# Patient Record
Sex: Male | Born: 1965 | Race: Black or African American | Hispanic: No | Marital: Single | Smoking: Never smoker
Health system: Southern US, Community
[De-identification: ages and names within clinical notes are randomized; demographics above are authoritative.]

## PROBLEM LIST (undated history)

## (undated) DIAGNOSIS — E876 Hypokalemia: Secondary | ICD-10-CM

## (undated) DIAGNOSIS — I10 Essential (primary) hypertension: Secondary | ICD-10-CM

## (undated) DIAGNOSIS — H409 Unspecified glaucoma: Secondary | ICD-10-CM

## (undated) DIAGNOSIS — R569 Unspecified convulsions: Secondary | ICD-10-CM

## (undated) HISTORY — PX: OTHER SURGICAL HISTORY: SHX169

---

## 1985-02-21 HISTORY — PX: LUNG SURGERY: SHX703

## 2012-04-04 ENCOUNTER — Emergency Department (HOSPITAL_BASED_OUTPATIENT_CLINIC_OR_DEPARTMENT_OTHER)
Admission: EM | Admit: 2012-04-04 | Discharge: 2012-04-04 | Disposition: A | Discharge status: Home / Self Care | Payer: Self-pay | Attending: Emergency Medicine | Admitting: Emergency Medicine

## 2012-04-04 ENCOUNTER — Encounter (HOSPITAL_BASED_OUTPATIENT_CLINIC_OR_DEPARTMENT_OTHER): Payer: Self-pay | Admitting: *Deleted

## 2012-04-04 DIAGNOSIS — R109 Unspecified abdominal pain: Secondary | ICD-10-CM | POA: Insufficient documentation

## 2012-04-04 DIAGNOSIS — M545 Low back pain, unspecified: Secondary | ICD-10-CM | POA: Insufficient documentation

## 2012-04-04 DIAGNOSIS — H409 Unspecified glaucoma: Secondary | ICD-10-CM | POA: Insufficient documentation

## 2012-04-04 DIAGNOSIS — R51 Headache: Secondary | ICD-10-CM | POA: Insufficient documentation

## 2012-04-04 HISTORY — DX: Unspecified glaucoma: H40.9

## 2012-04-04 LAB — COMPREHENSIVE METABOLIC PANEL
Albumin: 3.8 g/dL (ref 3.5–5.2)
BUN: 15 mg/dL (ref 6–23)
Calcium: 9.5 mg/dL (ref 8.4–10.5)
Creatinine, Ser: 1.1 mg/dL (ref 0.50–1.35)
Total Protein: 7.2 g/dL (ref 6.0–8.3)

## 2012-04-04 LAB — CBC WITH DIFFERENTIAL/PLATELET
Basophils Relative: 0 % (ref 0–1)
Eosinophils Absolute: 0 10*3/uL (ref 0.0–0.7)
HCT: 43.1 % (ref 39.0–52.0)
Hemoglobin: 15.3 g/dL (ref 13.0–17.0)
MCH: 30.9 pg (ref 26.0–34.0)
MCHC: 35.5 g/dL (ref 30.0–36.0)
Monocytes Absolute: 0.4 10*3/uL (ref 0.1–1.0)
Monocytes Relative: 8 % (ref 3–12)

## 2012-04-04 LAB — URINALYSIS, ROUTINE W REFLEX MICROSCOPIC
Bilirubin Urine: NEGATIVE
Glucose, UA: NEGATIVE mg/dL
Ketones, ur: NEGATIVE mg/dL
Nitrite: NEGATIVE
Protein, ur: NEGATIVE mg/dL
pH: 5.5 (ref 5.0–8.0)

## 2012-04-04 MED ORDER — TIMOLOL MALEATE 0.5 % OP SOLN: 1.0000 [drp] | Freq: Two times a day (BID) | OPHTHALMIC | Status: DC

## 2012-04-04 MED ORDER — TRAMADOL HCL 50 MG PO TABS: 50.0000 mg | ORAL_TABLET | Freq: Four times a day (QID) | ORAL | Status: DC | PRN

## 2012-04-04 NOTE — ED Notes (Signed)
Pt c/o h/a x 1 days and lower back pain x 3 days

## 2012-04-04 NOTE — ED Provider Notes (Signed)
History     CSN: [id number]  Arrival date & time 04/04/12  1540   First MD Initiated Contact with Patient 04/04/12 1545      Chief Complaint  Patient presents with  . Headache  . Abdominal Pain   (Consider location/radiation/quality/duration/timing/severity/associated sxs/prior treatment) Patient is a 47 y.o. male presenting with headaches and abdominal pain.  Headache Associated symptoms: abdominal pain   Abdominal Pain  Pt presents with two separate complaints. He states he woke up during the night with a moderate aching diffuse headache that was improved with wife massaging his scalp. He states he fell back asleep and was feeling better when he woke up. Pain is now a 1/10. Not the worst headache of his life and not sudden in onset. He has history of chronic glaucoma not using eye drops due to unable to afford the meds. He states has had symptoms like this with his glaucoma in the past.   He has a secondary complaint of moderate aching lower abdominal pain, radiating into R lumbar area, associated with dark urine, but no dysuria, hematuria, fever, vomiting diarrhea or constipation. He states he has had UTI in the past as well as 'liver damage', both diagnosed at [facility]. No particular provoking or relieving factors.   Past Medical History  Diagnosis Date  . Glaucoma     History reviewed. No pertinent past surgical history.  History reviewed. No pertinent family history.  History  Substance Use Topics  . Smoking status: Never Smoker   . Smokeless tobacco: Not on file  . Alcohol Use: 1.8 oz/week    3 Cans of beer per week      Review of Systems  Gastrointestinal: Positive for abdominal pain.  Neurological: Positive for headaches.   All other systems reviewed and are negative except as noted in HPI.   Allergies  Penicillins  Home Medications   Current Outpatient Rx  Name  Route  Sig  Dispense  Refill  . travoprost, benzalkonium, (TRAVATAN) 0.004 %  ophthalmic solution      1 drop at bedtime.           BP 128/89  Pulse 84  Temp(Src) 98.3 F (36.8 C) (Oral)  Resp 16  Ht 5\' 11"  (1.803 m)  Wt 190 lb (86.183 kg)  BMI 26.51 kg/m2  SpO2 99%  Physical Exam  Nursing note and vitals reviewed. Constitutional: He is oriented to person, place, and time. He appears well-developed and well-nourished.  HENT:  Head: Normocephalic and atraumatic.  Eyes: Conjunctivae and EOM are normal. Pupils are equal, round, and reactive to light. No scleral icterus.  Neck: Normal range of motion. Neck supple.  Cardiovascular: Normal rate, normal heart sounds and intact distal pulses.   Pulmonary/Chest: Effort normal and breath sounds normal.  Abdominal: Bowel sounds are normal. He exhibits no distension. There is tenderness (suprapubic). There is no rebound and no guarding.  Musculoskeletal: Normal range of motion. He exhibits no edema and no tenderness.  Neurological: He is alert and oriented to person, place, and time. He has normal strength. No cranial nerve deficit or sensory deficit.  Skin: Skin is warm and dry. No rash noted.  Psychiatric: He has a normal mood and affect.    ED Course  Procedures (including critical care time)  Labs Reviewed  URINALYSIS, ROUTINE W REFLEX MICROSCOPIC  CBC WITH DIFFERENTIAL  COMPREHENSIVE METABOLIC PANEL   No results found.   No diagnosis found.    MDM  No blurry vision, pupils normal,  no conjunctival injection to suggest acute glaucoma. Abdomen is benign, doubt surgical process. Will check labs/UA.    4:27 PM Lab computer is down temporarily but UA report shows 0.2 bili, otherwise normal.    5:24 PM Labs still not crossing over, but CBC and CMP results reviewed and normal. Will give Rx for pain meds as needed. Timolol to help with glaucoma because this medicine is relatively inexpensive. Advised close Ophtho follow up for recheck and to re-establish for long term care.   Charles B. Bernette Mayers,  MD 04/04/12 1725

## 2012-09-03 ENCOUNTER — Emergency Department (HOSPITAL_BASED_OUTPATIENT_CLINIC_OR_DEPARTMENT_OTHER)
Admission: EM | Admit: 2012-09-03 | Discharge: 2012-09-03 | Disposition: A | Discharge status: Home / Self Care | Payer: Self-pay | Attending: Emergency Medicine | Admitting: Emergency Medicine

## 2012-09-03 ENCOUNTER — Emergency Department (HOSPITAL_BASED_OUTPATIENT_CLINIC_OR_DEPARTMENT_OTHER): Payer: Self-pay

## 2012-09-03 ENCOUNTER — Encounter (HOSPITAL_BASED_OUTPATIENT_CLINIC_OR_DEPARTMENT_OTHER): Payer: Self-pay | Admitting: Family Medicine

## 2012-09-03 DIAGNOSIS — M25569 Pain in unspecified knee: Secondary | ICD-10-CM | POA: Insufficient documentation

## 2012-09-03 DIAGNOSIS — H409 Unspecified glaucoma: Secondary | ICD-10-CM | POA: Insufficient documentation

## 2012-09-03 DIAGNOSIS — M7989 Other specified soft tissue disorders: Secondary | ICD-10-CM | POA: Insufficient documentation

## 2012-09-03 DIAGNOSIS — M25561 Pain in right knee: Secondary | ICD-10-CM

## 2012-09-03 DIAGNOSIS — Z79899 Other long term (current) drug therapy: Secondary | ICD-10-CM | POA: Insufficient documentation

## 2012-09-03 DIAGNOSIS — Z880 Allergy status to penicillin: Secondary | ICD-10-CM | POA: Insufficient documentation

## 2012-09-03 LAB — CBC WITH DIFFERENTIAL/PLATELET
Eosinophils Relative: 1 % (ref 0–5)
HCT: 43.6 % (ref 39.0–52.0)
Lymphocytes Relative: 35 % (ref 12–46)
Lymphs Abs: 1.7 10*3/uL (ref 0.7–4.0)
MCV: 88.8 fL (ref 78.0–100.0)
Platelets: 216 10*3/uL (ref 150–400)
RBC: 4.91 MIL/uL (ref 4.22–5.81)
WBC: 4.7 10*3/uL (ref 4.0–10.5)

## 2012-09-03 LAB — BASIC METABOLIC PANEL
CO2: 22 mEq/L (ref 19–32)
Calcium: 9.8 mg/dL (ref 8.4–10.5)
Glucose, Bld: 84 mg/dL (ref 70–99)
Sodium: 140 mEq/L (ref 135–145)

## 2012-09-03 MED ORDER — HYDROCODONE-ACETAMINOPHEN 5-325 MG PO TABS: 2.0000 | ORAL_TABLET | ORAL | Status: DC | PRN

## 2012-09-03 MED ORDER — INDOMETHACIN 25 MG PO CAPS: 25.0000 mg | ORAL_CAPSULE | Freq: Three times a day (TID) | ORAL | Status: DC | PRN

## 2012-09-03 NOTE — ED Provider Notes (Signed)
Medical screening examination/treatment/procedure(s) were performed by non-physician practitioner and as supervising physician I was immediately available for consultation/collaboration.   Gwyneth Sprout, MD 09/03/12 2011

## 2012-09-03 NOTE — ED Notes (Signed)
Pt c/o right knee pain x 10 days, no known injury.

## 2012-09-03 NOTE — ED Provider Notes (Signed)
History    CSN: [id number] Arrival date & time 09/03/12  1558  First MD Initiated Contact with Patient 09/03/12 1614     Chief Complaint  Patient presents with  . Knee Pain   (Consider location/radiation/quality/duration/timing/severity/associated sxs/prior Treatment) Patient is a 47 y.o. male presenting with knee pain. The history is provided by the patient. No language interpreter was used.  Knee Pain Location:  Knee Time since incident:  10 days Injury: no   Knee location:  R knee Pain details:    Quality:  Aching   Severity:  Moderate   Duration:  10 days   Timing:  Constant   Progression:  Worsening Chronicity:  New Dislocation: no   Prior injury to area:  No Relieved by:  Nothing Pt complains of pain in his right knee.  Pt reports knee has felt hot like it is infected  Pt denies fever or chills, no illness. No uti or uri symptoms.  No std risk Past Medical History  Diagnosis Date  . Glaucoma    Past Surgical History  Procedure Laterality Date  . Arm surgery     No family history on file. History  Substance Use Topics  . Smoking status: Never Smoker   . Smokeless tobacco: Not on file  . Alcohol Use: 1.8 oz/week    3 Cans of beer per week    Review of Systems  Musculoskeletal: Positive for myalgias.  All other systems reviewed and are negative.    Allergies  Penicillins  Home Medications   Current Outpatient Rx  Name  Route  Sig  Dispense  Refill  . timolol (TIMOPTIC) 0.5 % ophthalmic solution   Ophthalmic   Apply 1 drop to eye every 12 (twelve) hours.   15 mL   1   . traMADol (ULTRAM) 50 MG tablet   Oral   Take 1 tablet (50 mg total) by mouth every 6 (six) hours as needed for pain.   15 tablet   0   . travoprost, benzalkonium, (TRAVATAN) 0.004 % ophthalmic solution      1 drop at bedtime.          BP 140/82  Pulse 82  Temp(Src) 98.5 F (36.9 C) (Oral)  Resp 18  Ht 5\' 11"  (1.803 m)  Wt 193 lb (87.544 kg)  BMI 26.93 kg/m2   SpO2 100% Physical Exam  Nursing note and vitals reviewed. Constitutional: He is oriented to person, place, and time. He appears well-developed and well-nourished.  Musculoskeletal: Normal range of motion.  Right knee slight swelling,  No effusion,  No redness,  nv and ns intact  Neurological: He is alert and oriented to person, place, and time. He has normal reflexes.  Skin: Skin is warm.    ED Course  Procedures (including critical care time) Labs Reviewed  CBC WITH DIFFERENTIAL  BASIC METABOLIC PANEL  URIC ACID  URINALYSIS, ROUTINE W REFLEX MICROSCOPIC   Dg Knee Complete 4 Views Right  09/03/2012   *RADIOLOGY REPORT*  Clinical Data: Right knee pain.  RIGHT KNEE - COMPLETE 4+ VIEW  Comparison: None.  Findings: Four views of the knee were obtained.  Negative for fracture or dislocation.  Mild degenerative changes in the medial knee compartment and patellofemoral compartment.  There may be a small suprapatellar joint effusion.  IMPRESSION: Mild degenerative changes.  No acute bony abnormality.   Original Report Authenticated By: Richarda Overlie, M.D.   No diagnosis found.  MDM  Cbc normal.  Xray shows degenerative changes  uric acid is slightly elevated.   I suspect gout.  Pt advised to follow up with Dr. Pearletha Forge for recheck in 1 week.   Rx for indocin and hydrocodone  Elson Areas, [place] 09/03/12 1812

## 2012-09-03 NOTE — ED Notes (Signed)
pa at bedside. 

## 2012-10-08 ENCOUNTER — Encounter (HOSPITAL_BASED_OUTPATIENT_CLINIC_OR_DEPARTMENT_OTHER): Payer: Self-pay | Admitting: *Deleted

## 2012-10-08 ENCOUNTER — Emergency Department (HOSPITAL_BASED_OUTPATIENT_CLINIC_OR_DEPARTMENT_OTHER)
Admission: EM | Admit: 2012-10-08 | Discharge: 2012-10-08 | Disposition: A | Discharge status: Home / Self Care | Payer: Self-pay | Attending: Emergency Medicine | Admitting: Emergency Medicine

## 2012-10-08 DIAGNOSIS — Z880 Allergy status to penicillin: Secondary | ICD-10-CM | POA: Insufficient documentation

## 2012-10-08 DIAGNOSIS — Z8669 Personal history of other diseases of the nervous system and sense organs: Secondary | ICD-10-CM | POA: Insufficient documentation

## 2012-10-08 DIAGNOSIS — Z202 Contact with and (suspected) exposure to infections with a predominantly sexual mode of transmission: Secondary | ICD-10-CM | POA: Insufficient documentation

## 2012-10-08 MED ORDER — ONDANSETRON 4 MG PO TBDP
4.0000 mg | ORAL_TABLET | Freq: Once | ORAL | Status: AC
  Administered 2012-10-08: 4 mg via ORAL
  Filled 2012-10-08: qty 1

## 2012-10-08 MED ORDER — CEFTRIAXONE SODIUM 250 MG IJ SOLR
250.0000 mg | Freq: Once | INTRAMUSCULAR | Status: AC
  Administered 2012-10-08: 250 mg via INTRAMUSCULAR
  Filled 2012-10-08: qty 250

## 2012-10-08 MED ORDER — AZITHROMYCIN 1 G PO PACK
1.0000 g | PACK | Freq: Once | ORAL | Status: AC
  Administered 2012-10-08: 1 g via ORAL
  Filled 2012-10-08: qty 1

## 2012-10-08 MED ORDER — LIDOCAINE HCL (PF) 1 % IJ SOLN
INTRAMUSCULAR | Status: AC
  Administered 2012-10-08: 5 mL
  Filled 2012-10-08: qty 5

## 2012-10-08 MED ORDER — METRONIDAZOLE 500 MG PO TABS
2000.0000 mg | ORAL_TABLET | Freq: Once | ORAL | Status: AC
  Administered 2012-10-08: 2000 mg via ORAL
  Filled 2012-10-08: qty 4

## 2012-10-08 NOTE — ED Notes (Signed)
Pt c/o penile discharge x 3 weeks
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
## 2012-12-30 ENCOUNTER — Emergency Department (HOSPITAL_BASED_OUTPATIENT_CLINIC_OR_DEPARTMENT_OTHER)
Admission: EM | Admit: 2012-12-30 | Discharge: 2012-12-30 | Disposition: A | Discharge status: Home / Self Care | Payer: Medicaid - Out of State | Attending: Emergency Medicine | Admitting: Emergency Medicine

## 2012-12-30 ENCOUNTER — Emergency Department (HOSPITAL_BASED_OUTPATIENT_CLINIC_OR_DEPARTMENT_OTHER): Payer: Medicaid - Out of State

## 2012-12-30 ENCOUNTER — Encounter (HOSPITAL_BASED_OUTPATIENT_CLINIC_OR_DEPARTMENT_OTHER): Payer: Self-pay | Admitting: Emergency Medicine

## 2012-12-30 DIAGNOSIS — S335XXA Sprain of ligaments of lumbar spine, initial encounter: Secondary | ICD-10-CM | POA: Insufficient documentation

## 2012-12-30 DIAGNOSIS — S39012A Strain of muscle, fascia and tendon of lower back, initial encounter: Secondary | ICD-10-CM

## 2012-12-30 DIAGNOSIS — Y9389 Activity, other specified: Secondary | ICD-10-CM | POA: Insufficient documentation

## 2012-12-30 DIAGNOSIS — Z79899 Other long term (current) drug therapy: Secondary | ICD-10-CM | POA: Insufficient documentation

## 2012-12-30 DIAGNOSIS — H409 Unspecified glaucoma: Secondary | ICD-10-CM | POA: Insufficient documentation

## 2012-12-30 DIAGNOSIS — Y92410 Unspecified street and highway as the place of occurrence of the external cause: Secondary | ICD-10-CM | POA: Insufficient documentation

## 2012-12-30 DIAGNOSIS — Z880 Allergy status to penicillin: Secondary | ICD-10-CM | POA: Insufficient documentation

## 2012-12-30 MED ORDER — DORZOLAMIDE HCL-TIMOLOL MAL 2-0.5 % OP SOLN: 1.0000 [drp] | Freq: Two times a day (BID) | OPHTHALMIC | Status: AC

## 2012-12-30 MED ORDER — TETRACAINE HCL 0.5 % OP SOLN
OPHTHALMIC | Status: AC
  Filled 2012-12-30: qty 2

## 2012-12-30 MED ORDER — TRAVOPROST (BAK FREE) 0.004 % OP SOLN: 1.0000 [drp] | Freq: Every day | OPHTHALMIC | Status: AC

## 2012-12-30 MED ORDER — TRAMADOL HCL 50 MG PO TABS: 50.0000 mg | ORAL_TABLET | Freq: Four times a day (QID) | ORAL | Status: AC | PRN

## 2012-12-30 NOTE — ED Notes (Signed)
Patient ambulatory for visual acuity without difficulty or assistance.
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
## 2012-12-30 NOTE — ED Notes (Signed)
rx x 3 given for tramadol, cosopt and travatan

## 2020-08-18 ENCOUNTER — Other Ambulatory Visit: Payer: Self-pay

## 2020-08-18 ENCOUNTER — Emergency Department (HOSPITAL_BASED_OUTPATIENT_CLINIC_OR_DEPARTMENT_OTHER)
Admission: EM | Admit: 2020-08-18 | Discharge: 2020-08-18 | Disposition: A | Discharge status: Home / Self Care | Payer: Medicaid - Out of State | Attending: Emergency Medicine | Admitting: Emergency Medicine

## 2020-08-18 ENCOUNTER — Encounter (HOSPITAL_BASED_OUTPATIENT_CLINIC_OR_DEPARTMENT_OTHER): Payer: Self-pay | Admitting: Emergency Medicine

## 2020-08-18 DIAGNOSIS — R066 Hiccough: Secondary | ICD-10-CM | POA: Insufficient documentation

## 2020-08-18 DIAGNOSIS — Z202 Contact with and (suspected) exposure to infections with a predominantly sexual mode of transmission: Secondary | ICD-10-CM | POA: Insufficient documentation

## 2020-08-18 DIAGNOSIS — I10 Essential (primary) hypertension: Secondary | ICD-10-CM | POA: Diagnosis not present

## 2020-08-18 DIAGNOSIS — M545 Low back pain, unspecified: Secondary | ICD-10-CM | POA: Diagnosis not present

## 2020-08-18 HISTORY — DX: Essential (primary) hypertension: I10

## 2020-08-18 HISTORY — DX: Unspecified convulsions: R56.9

## 2020-08-18 HISTORY — DX: Hypokalemia: E87.6

## 2020-08-18 LAB — URINALYSIS, ROUTINE W REFLEX MICROSCOPIC
Bilirubin Urine: NEGATIVE
Glucose, UA: NEGATIVE mg/dL
Hgb urine dipstick: NEGATIVE
Ketones, ur: NEGATIVE mg/dL
Leukocytes,Ua: NEGATIVE
Nitrite: NEGATIVE
Protein, ur: NEGATIVE mg/dL
Specific Gravity, Urine: <1.005 — ABNORMAL LOW (ref 1.005–1.030)
pH: 6 (ref 5.0–8.0)

## 2020-08-18 MED ORDER — LIDOCAINE HCL (PF) 1 % IJ SOLN
1.0000 mL | Freq: Once | INTRAMUSCULAR | Status: AC
  Administered 2020-08-18: 1 mL
  Filled 2020-08-18: qty 5

## 2020-08-18 MED ORDER — AZITHROMYCIN 250 MG PO TABS
1000.0000 mg | ORAL_TABLET | Freq: Once | ORAL | Status: AC
  Administered 2020-08-18: 1000 mg via ORAL
  Filled 2020-08-18: qty 4

## 2020-08-18 MED ORDER — OMEPRAZOLE 40 MG PO CPDR: 40.0000 mg | DELAYED_RELEASE_CAPSULE | Freq: Two times a day (BID) | ORAL | 0 refills | Status: AC

## 2020-08-18 MED ORDER — CEFTRIAXONE SODIUM 500 MG IJ SOLR
500.0000 mg | Freq: Once | INTRAMUSCULAR | Status: AC
  Administered 2020-08-18: 500 mg via INTRAMUSCULAR
  Filled 2020-08-18: qty 500

## 2020-08-18 MED ORDER — LIDOCAINE VISCOUS HCL 2 % MT SOLN
15.0000 mL | Freq: Once | OROMUCOSAL | Status: AC
  Administered 2020-08-18: 15 mL via ORAL
  Filled 2020-08-18 (×2): qty 15

## 2020-08-18 MED ORDER — METRONIDAZOLE 500 MG PO TABS
2000.0000 mg | ORAL_TABLET | Freq: Once | ORAL | Status: AC
  Administered 2020-08-18: 2000 mg via ORAL
  Filled 2020-08-18: qty 4

## 2020-08-18 MED ORDER — METHOCARBAMOL 500 MG PO TABS
500.0000 mg | ORAL_TABLET | Freq: Once | ORAL | Status: AC
  Administered 2020-08-18: 500 mg via ORAL
  Filled 2020-08-18: qty 1

## 2020-08-18 MED ORDER — ALUM & MAG HYDROXIDE-SIMETH 200-200-20 MG/5ML PO SUSP
30.0000 mL | Freq: Once | ORAL | Status: AC
  Administered 2020-08-18: 30 mL via ORAL
  Filled 2020-08-18: qty 30

## 2020-08-18 MED ORDER — METHOCARBAMOL 500 MG PO TABS: 500.0000 mg | ORAL_TABLET | Freq: Two times a day (BID) | ORAL | 0 refills | Status: DC
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
## 2020-08-18 NOTE — Discharge Instructions (Addendum)
To help with hiccups please begin taking omeprazole twice daily before breakfast and bedtime. If you continue having frequent hiccups you will need to follow-up with your primary care doctor for further evaluation.  For back pain use ibuprofen, Tylenol and you can use prescribed muscle relaxer as needed.  You can also use over-the-counter Salonpas lidocaine patches ice and heat.  Follow-up with PCP if back pain is not improving.  You were prophylactically treated for STDs today, you have testing pending will be called by phone if any of your results are positive.  Please make sure you and your partner have both been tested and completely treated prior to being sexually active again.

## 2020-08-18 NOTE — ED Notes (Signed)
Called lab to add on GC/Chlamydia test

## 2020-08-19 LAB — GC/CHLAMYDIA PROBE AMP (~~LOC~~) NOT AT ARMC
Chlamydia: NEGATIVE
Comment: NEGATIVE
Comment: NORMAL
Neisseria Gonorrhea: NEGATIVE

## 2020-09-24 ENCOUNTER — Other Ambulatory Visit: Payer: Self-pay

## 2020-09-24 ENCOUNTER — Encounter (HOSPITAL_BASED_OUTPATIENT_CLINIC_OR_DEPARTMENT_OTHER): Payer: Self-pay

## 2020-09-24 ENCOUNTER — Emergency Department (HOSPITAL_BASED_OUTPATIENT_CLINIC_OR_DEPARTMENT_OTHER)
Admission: EM | Admit: 2020-09-24 | Discharge: 2020-09-24 | Disposition: A | Discharge status: Home / Self Care | Payer: Medicaid - Out of State | Attending: Emergency Medicine | Admitting: Emergency Medicine

## 2020-09-24 ENCOUNTER — Emergency Department (HOSPITAL_BASED_OUTPATIENT_CLINIC_OR_DEPARTMENT_OTHER): Payer: Medicaid - Out of State

## 2020-09-24 DIAGNOSIS — M545 Low back pain, unspecified: Secondary | ICD-10-CM | POA: Insufficient documentation

## 2020-09-24 DIAGNOSIS — I10 Essential (primary) hypertension: Secondary | ICD-10-CM | POA: Diagnosis not present

## 2020-09-24 MED ORDER — KETOROLAC TROMETHAMINE 30 MG/ML IJ SOLN
30.0000 mg | Freq: Once | INTRAMUSCULAR | Status: AC
  Administered 2020-09-24: 30 mg via INTRAMUSCULAR
  Filled 2020-09-24: qty 1

## 2020-09-24 MED ORDER — DICLOFENAC SODIUM 1 % EX GEL: 2.0000 g | Freq: Four times a day (QID) | CUTANEOUS | 0 refills | Status: AC | PRN

## 2020-09-24 MED ORDER — IBUPROFEN 600 MG PO TABS: 600.0000 mg | ORAL_TABLET | Freq: Four times a day (QID) | ORAL | 0 refills | Status: AC | PRN

## 2020-09-24 MED ORDER — TIZANIDINE HCL 4 MG PO TABS: 4.0000 mg | ORAL_TABLET | Freq: Four times a day (QID) | ORAL | 0 refills | Status: AC | PRN

## 2020-09-24 NOTE — Discharge Instructions (Addendum)
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
## 2020-09-24 NOTE — ED Triage Notes (Signed)
Reports back pain for a couple weeks unsure if injured it at work or kidney infection.

## 2022-05-03 IMAGING — CR DG LUMBAR SPINE COMPLETE 4+V
5 series · 5 of 5 positions shown · non-contrast
Comparison: None.

CLINICAL DATA: Back pain

EXAM:
LUMBAR SPINE - COMPLETE 4+ VIEW

[t l-spine a.p.]
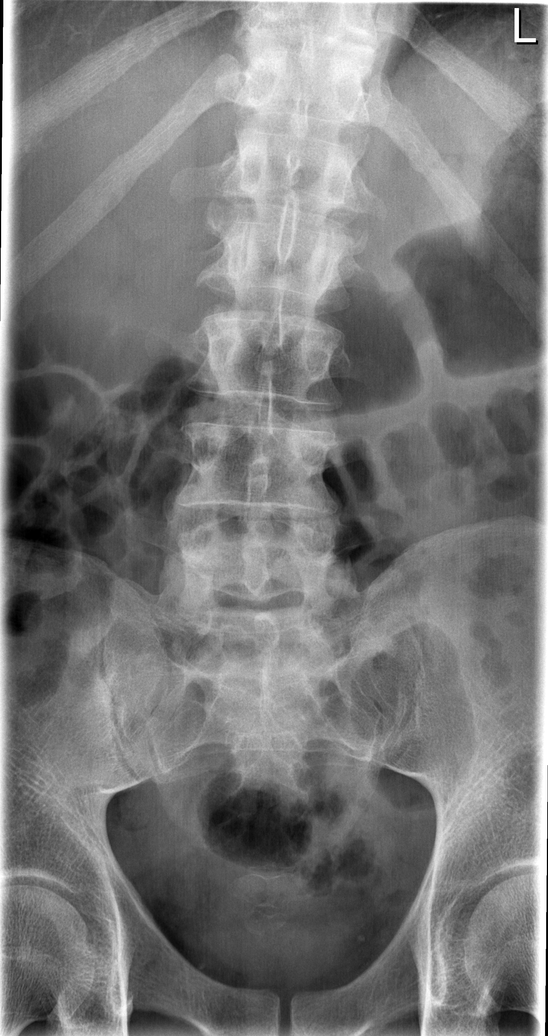

[t l-spine oblique exposure (1 of 2)]
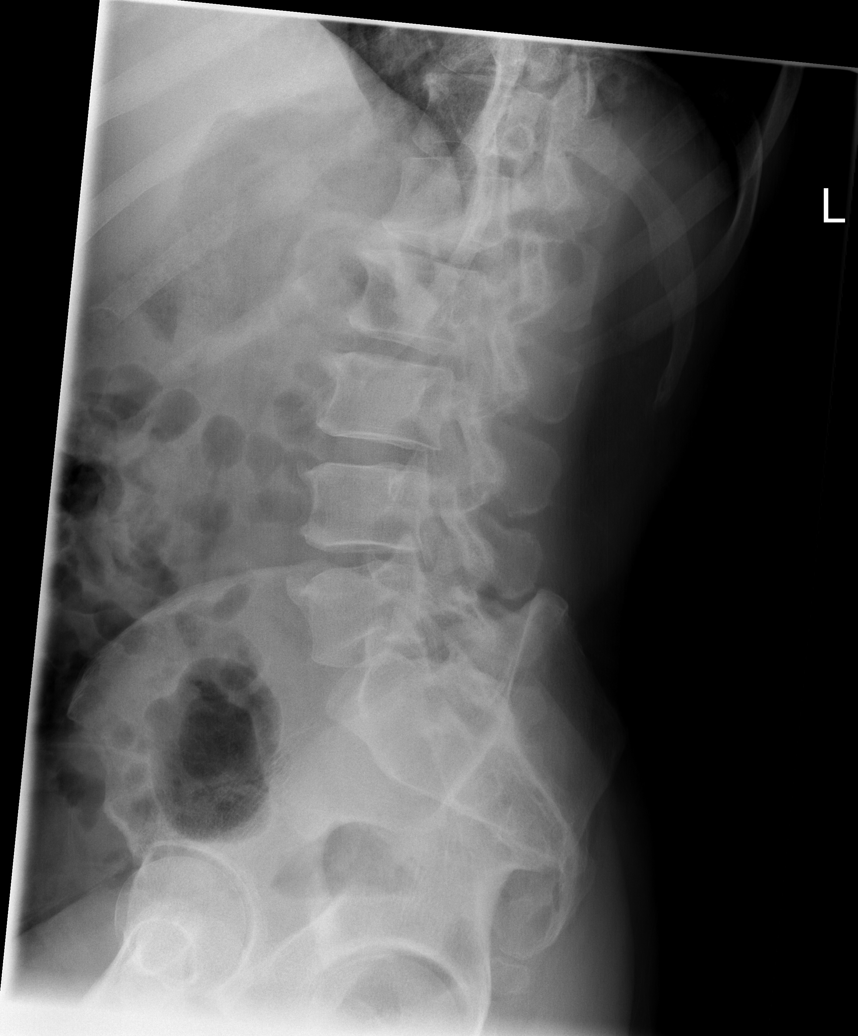

[t l-spine oblique exposure (2 of 2)]
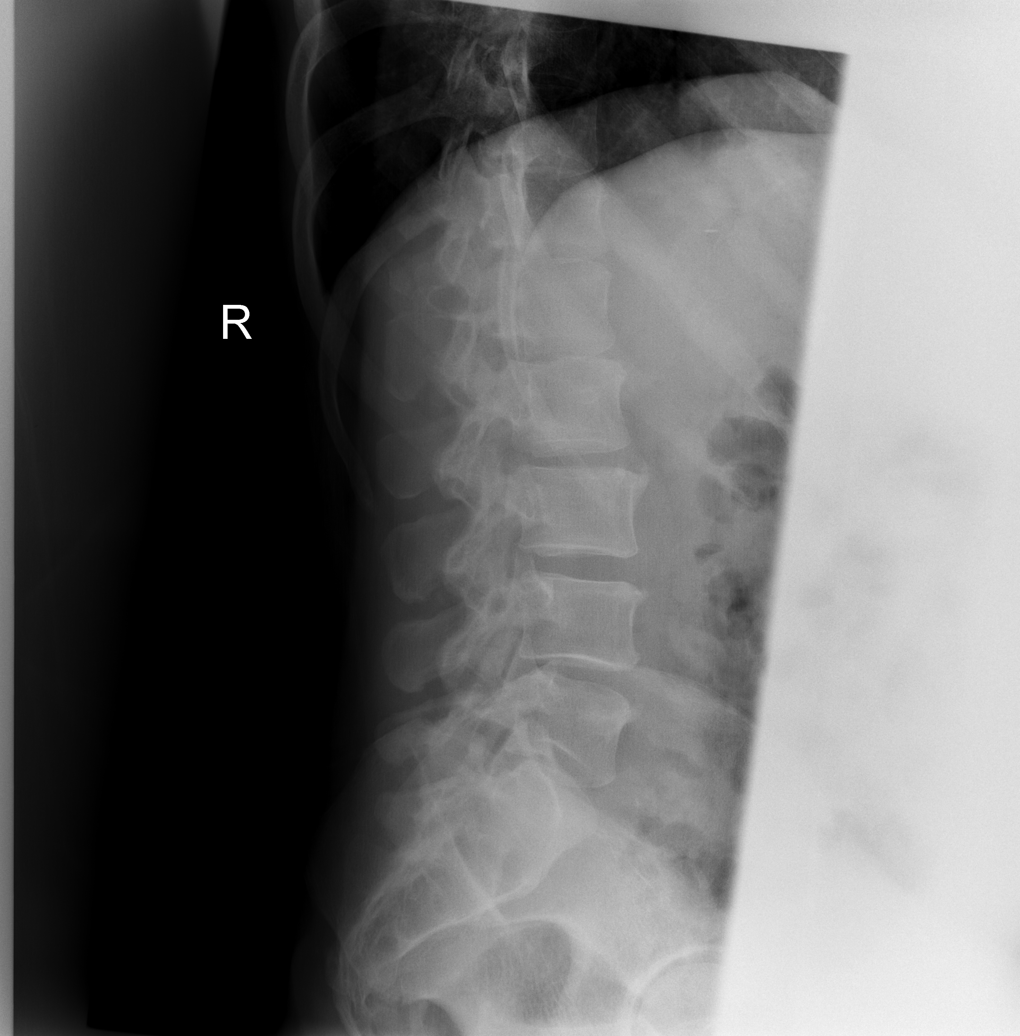

[t l-spine lat]
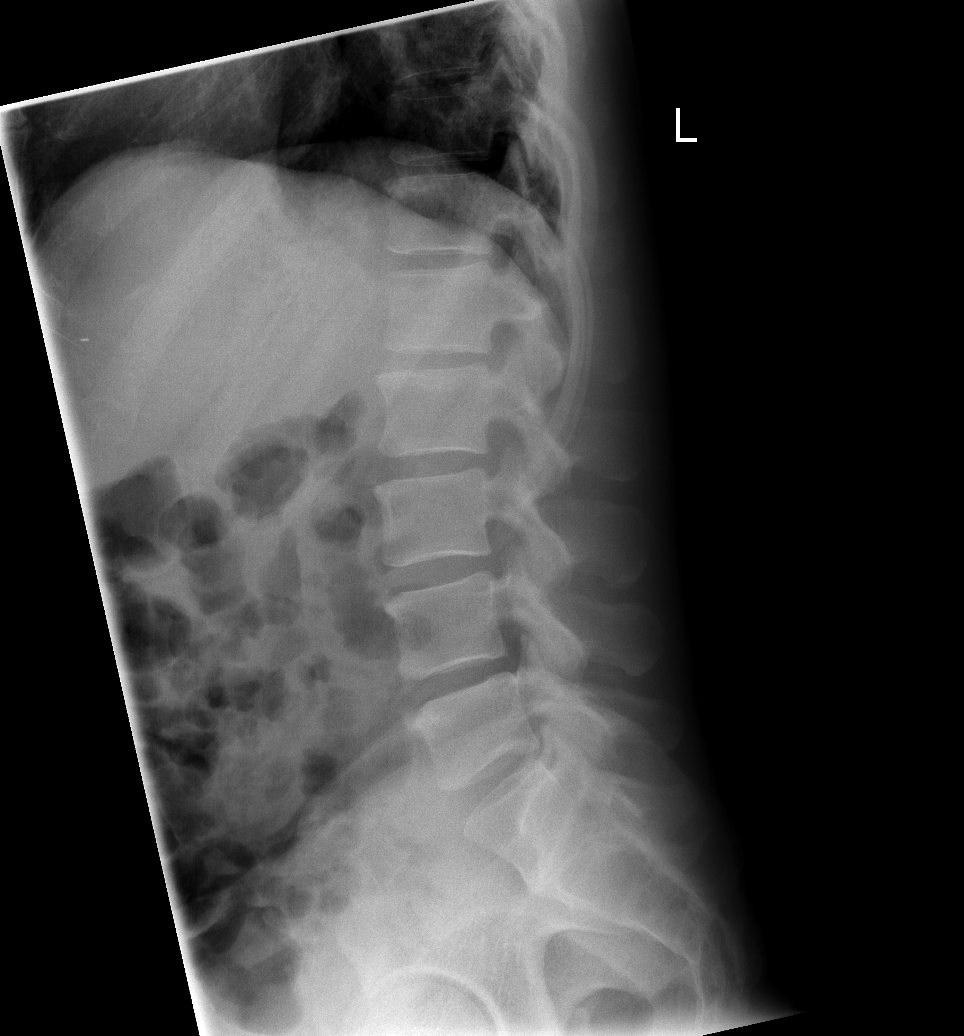

[t l-spine l5-s1 spot]
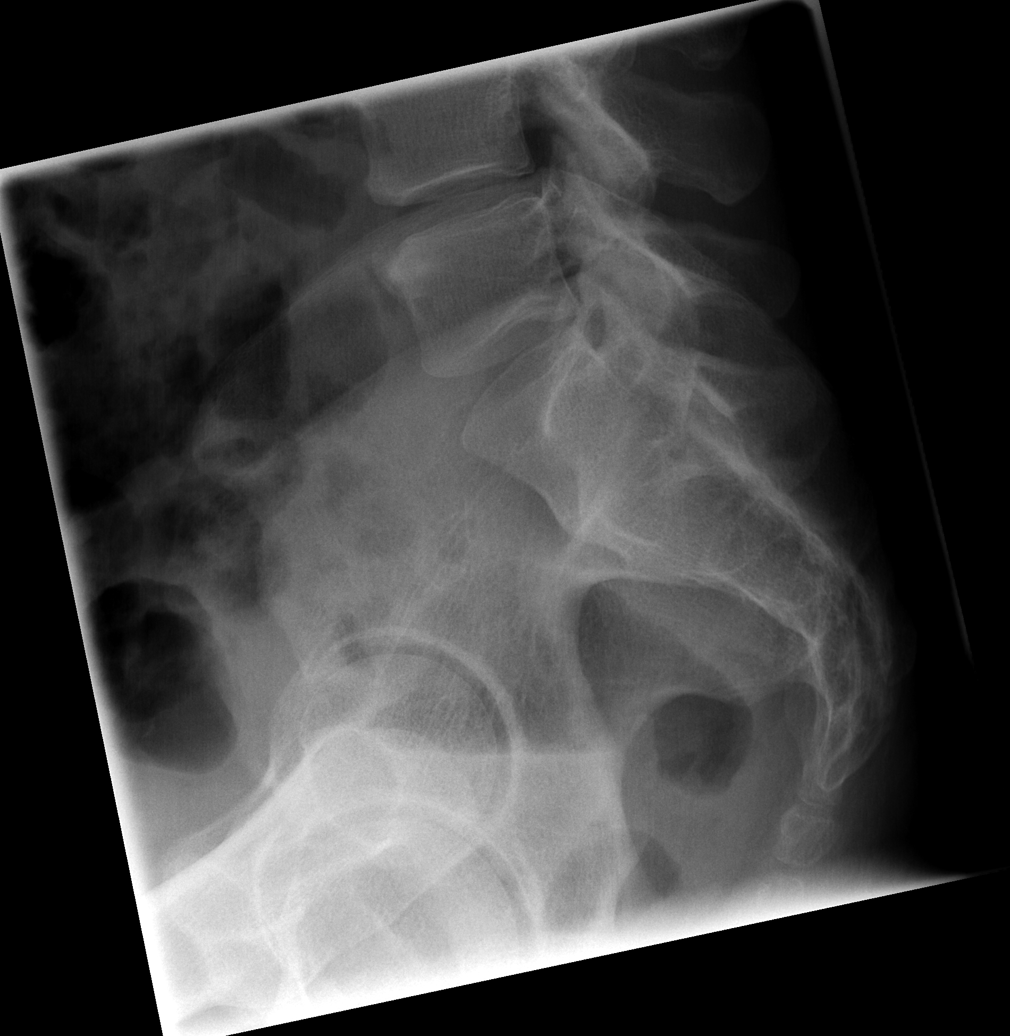

[5 of 5 positions shown; findings below may reference images not displayed]

FINDINGS: There is no evidence of lumbar spine fracture. Alignment is normal.
Intervertebral disc spaces are maintained.
IMPRESSION: Negative.
